# Patient Record
Sex: Female | Born: 1955 | Race: White | Hispanic: No | Marital: Married | Smoking: Former smoker
Health system: Southern US, Community
[De-identification: ages and names within clinical notes are randomized; demographics above are authoritative.]

## PROBLEM LIST (undated history)

## (undated) DIAGNOSIS — M25551 Pain in right hip: Secondary | ICD-10-CM

## (undated) DIAGNOSIS — M1990 Unspecified osteoarthritis, unspecified site: Secondary | ICD-10-CM

## (undated) DIAGNOSIS — G8929 Other chronic pain: Secondary | ICD-10-CM

## (undated) DIAGNOSIS — R918 Other nonspecific abnormal finding of lung field: Secondary | ICD-10-CM

## (undated) DIAGNOSIS — J449 Chronic obstructive pulmonary disease, unspecified: Secondary | ICD-10-CM

---

## 2010-03-01 ENCOUNTER — Ambulatory Visit: Payer: Self-pay | Admitting: Diagnostic Radiology

## 2010-03-01 ENCOUNTER — Ambulatory Visit: Payer: Self-pay | Admitting: Radiology

## 2010-03-01 ENCOUNTER — Emergency Department (HOSPITAL_BASED_OUTPATIENT_CLINIC_OR_DEPARTMENT_OTHER): Admission: EM | Admit: 2010-03-01 | Discharge: 2010-03-01 | Payer: Self-pay | Admitting: Emergency Medicine

## 2012-02-28 ENCOUNTER — Emergency Department (HOSPITAL_BASED_OUTPATIENT_CLINIC_OR_DEPARTMENT_OTHER)
Admission: EM | Admit: 2012-02-28 | Discharge: 2012-02-28 | Disposition: A | Discharge status: Home / Self Care | Payer: Worker's Compensation | Attending: Emergency Medicine | Admitting: Emergency Medicine

## 2012-02-28 ENCOUNTER — Encounter (HOSPITAL_BASED_OUTPATIENT_CLINIC_OR_DEPARTMENT_OTHER): Payer: Self-pay | Admitting: *Deleted

## 2012-02-28 ENCOUNTER — Emergency Department (HOSPITAL_BASED_OUTPATIENT_CLINIC_OR_DEPARTMENT_OTHER): Payer: Worker's Compensation

## 2012-02-28 DIAGNOSIS — Y990 Civilian activity done for income or pay: Secondary | ICD-10-CM | POA: Insufficient documentation

## 2012-02-28 DIAGNOSIS — G8929 Other chronic pain: Secondary | ICD-10-CM | POA: Insufficient documentation

## 2012-02-28 DIAGNOSIS — Y929 Unspecified place or not applicable: Secondary | ICD-10-CM | POA: Insufficient documentation

## 2012-02-28 DIAGNOSIS — W208XXA Other cause of strike by thrown, projected or falling object, initial encounter: Secondary | ICD-10-CM | POA: Insufficient documentation

## 2012-02-28 DIAGNOSIS — Z79899 Other long term (current) drug therapy: Secondary | ICD-10-CM | POA: Insufficient documentation

## 2012-02-28 DIAGNOSIS — S9031XA Contusion of right foot, initial encounter: Secondary | ICD-10-CM

## 2012-02-28 DIAGNOSIS — S9030XA Contusion of unspecified foot, initial encounter: Secondary | ICD-10-CM | POA: Insufficient documentation

## 2012-02-28 DIAGNOSIS — Z87891 Personal history of nicotine dependence: Secondary | ICD-10-CM | POA: Insufficient documentation

## 2012-02-28 HISTORY — DX: Pain in right hip: M25.551

## 2012-02-28 HISTORY — DX: Other chronic pain: G89.29

## 2012-02-28 MED ORDER — OXYCODONE-ACETAMINOPHEN 5-325 MG PO TABS: 1.0000 | ORAL_TABLET | ORAL | Status: AC | PRN

## 2012-02-28 NOTE — ED Notes (Signed)
Pt is here with right foot pain after a work accident where a machine fell down on her foot.  Pt has foot wrapped in towel

## 2012-02-28 NOTE — ED Notes (Signed)
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

## 2012-02-28 NOTE — ED Provider Notes (Signed)
History     CSN: [id number]  Arrival date & time 02/28/12  1011   First MD Initiated Contact with Patient 02/28/12 1059      Chief Complaint  Patient presents with  . Foot Injury    (Consider location/radiation/quality/duration/timing/severity/associated sxs/prior treatment) Patient is a 56 y.o. female presenting with foot injury. The history is provided by the patient.  Foot Injury   She states that the door of a prior came off its hinges and fell landing on her right foot. She is complaining of severe pain in that foot and rates pain at 8/10. Is not able to ambulate because of pain. She denies other injury.  Past Medical History  Diagnosis Date  . Chronic right hip pain     History reviewed. No pertinent past surgical history.  No family history on file.  History  Substance Use Topics  . Smoking status: Former Games developer  . Smokeless tobacco: Not on file  . Alcohol Use: Yes     Comment: occasionally    OB History    Grav Para Term Preterm Abortions TAB SAB Ect Mult Living                  Review of Systems  All other systems reviewed and are negative.    Allergies  Penicillins  Home Medications   Current Outpatient Rx  Name  Route  Sig  Dispense  Refill  . HYDROCODONE-ACETAMINOPHEN 5-325 MG PO TABS   Oral   Take 1 tablet by mouth every 6 (six) hours as needed.           BP 132/102  Pulse 67  Temp 97.7 F (36.5 C) (Oral)  Resp 18  SpO2 100%  Physical Exam  Nursing note and vitals reviewed.  56 year old female, resting comfortably and in no acute distress. Vital signs are  Significant for diastolic hypertension with blood pressure 132/102. Oxygen saturation is 100%, which is normal. Head is normocephalic and atraumatic. PERRLA. Oropharynx is clear. Strabismus is present with right eye deviated medially. Neck is nontender and supple without adenopathy or JVD. Back is nontender and there is no CVA tenderness. Lungs are clear without rales,  wheezes, or rhonchi. Chest is nontender. Heart has regular rate and rhythm without murmur. Abdomen is soft, flat, nontender without masses or hepatosplenomegaly and peristalsis is normoactive. Extremities have no cyanosis or edema, full range of motion is present. Skin is warm and dry without rash. Neurologic: Mental status is normal, cranial nerves are intact, there are no motor or sensory deficits.  ED Course  Procedures (including critical care time)  Dg Foot Complete Right  02/28/2012  *RADIOLOGY REPORT*  Clinical Data: Pain and bruising, trauma  RIGHT FOOT COMPLETE - 3+ VIEW  Comparison: None  Findings: Osseous demineralization. Joint spaces preserved. No acute fracture, dislocation or bone destruction.  IMPRESSION: No acute osseous abnormalities.   Original Report Authenticated By: Ulyses Southward, M.D.    Images viewed by me.   1. Contusion of foot, right       MDM  Contusion of right foot without evidence of fracture. She's not able to ambulate so she'll be given crutches and given a prescription for Percocet for pain.        Dione Booze, MD 02/28/12 1113

## 2018-02-19 ENCOUNTER — Emergency Department (HOSPITAL_BASED_OUTPATIENT_CLINIC_OR_DEPARTMENT_OTHER): Payer: Medicare HMO

## 2018-02-19 ENCOUNTER — Encounter (HOSPITAL_BASED_OUTPATIENT_CLINIC_OR_DEPARTMENT_OTHER): Payer: Self-pay | Admitting: *Deleted

## 2018-02-19 ENCOUNTER — Emergency Department (HOSPITAL_BASED_OUTPATIENT_CLINIC_OR_DEPARTMENT_OTHER)
Admission: EM | Admit: 2018-02-19 | Discharge: 2018-02-20 | Disposition: A | Discharge status: Other Acute Inpatient Hospital | Payer: Medicare HMO | Attending: Emergency Medicine | Admitting: Emergency Medicine

## 2018-02-19 ENCOUNTER — Other Ambulatory Visit: Payer: Self-pay

## 2018-02-19 DIAGNOSIS — R070 Pain in throat: Secondary | ICD-10-CM | POA: Insufficient documentation

## 2018-02-19 DIAGNOSIS — K56609 Unspecified intestinal obstruction, unspecified as to partial versus complete obstruction: Secondary | ICD-10-CM | POA: Diagnosis not present

## 2018-02-19 DIAGNOSIS — J449 Chronic obstructive pulmonary disease, unspecified: Secondary | ICD-10-CM | POA: Diagnosis not present

## 2018-02-19 DIAGNOSIS — Z87891 Personal history of nicotine dependence: Secondary | ICD-10-CM | POA: Insufficient documentation

## 2018-02-19 DIAGNOSIS — R109 Unspecified abdominal pain: Secondary | ICD-10-CM | POA: Diagnosis present

## 2018-02-19 DIAGNOSIS — Z79899 Other long term (current) drug therapy: Secondary | ICD-10-CM | POA: Insufficient documentation

## 2018-02-19 DIAGNOSIS — Z0189 Encounter for other specified special examinations: Secondary | ICD-10-CM

## 2018-02-19 HISTORY — DX: Other nonspecific abnormal finding of lung field: R91.8

## 2018-02-19 HISTORY — DX: Unspecified osteoarthritis, unspecified site: M19.90

## 2018-02-19 HISTORY — DX: Chronic obstructive pulmonary disease, unspecified: J44.9

## 2018-02-19 LAB — COMPREHENSIVE METABOLIC PANEL
ALBUMIN: 4.8 g/dL (ref 3.5–5.0)
ALK PHOS: 52 U/L (ref 38–126)
ALT: 25 U/L (ref 0–44)
AST: 31 U/L (ref 15–41)
Anion gap: 12 (ref 5–15)
BILIRUBIN TOTAL: 0.2 mg/dL — AB (ref 0.3–1.2)
BUN: 9 mg/dL (ref 8–23)
CALCIUM: 9.7 mg/dL (ref 8.9–10.3)
CO2: 26 mmol/L (ref 22–32)
Chloride: 101 mmol/L (ref 98–111)
Creatinine, Ser: 0.86 mg/dL (ref 0.44–1.00)
GFR calc Af Amer: >60 mL/min (ref >60)
GFR calc non Af Amer: >60 mL/min (ref >60)
GLUCOSE: 128 mg/dL — AB (ref 70–99)
Potassium: 4.5 mmol/L (ref 3.5–5.1)
SODIUM: 139 mmol/L (ref 135–145)
TOTAL PROTEIN: 8.1 g/dL (ref 6.5–8.1)

## 2018-02-19 LAB — CBC
HCT: 43.7 % (ref 36.0–46.0)
Hemoglobin: 14.3 g/dL (ref 12.0–15.0)
MCH: 31.3 pg (ref 26.0–34.0)
MCHC: 32.7 g/dL (ref 30.0–36.0)
MCV: 95.6 fL (ref 80.0–100.0)
PLATELETS: 247 10*3/uL (ref 150–400)
RBC: 4.57 MIL/uL (ref 3.87–5.11)
RDW: 12.3 % (ref 11.5–15.5)
WBC: 13.3 10*3/uL — ABNORMAL HIGH (ref 4.0–10.5)
nRBC: 0 % (ref 0.0–0.2)

## 2018-02-19 LAB — URINALYSIS, ROUTINE W REFLEX MICROSCOPIC
Bilirubin Urine: NEGATIVE
GLUCOSE, UA: NEGATIVE mg/dL
HGB URINE DIPSTICK: NEGATIVE
Ketones, ur: NEGATIVE mg/dL
Nitrite: NEGATIVE
PROTEIN: NEGATIVE mg/dL
Specific Gravity, Urine: 1.01 (ref 1.005–1.030)
pH: 7 (ref 5.0–8.0)

## 2018-02-19 LAB — URINALYSIS, MICROSCOPIC (REFLEX): RBC / HPF: NONE SEEN RBC/hpf (ref 0–5)

## 2018-02-19 LAB — LIPASE, BLOOD: Lipase: 42 U/L (ref 11–51)

## 2018-02-19 MED ORDER — LACTATED RINGERS IV BOLUS
1000.0000 mL | Freq: Once | INTRAVENOUS | Status: AC
  Administered 2018-02-19: 1000 mL via INTRAVENOUS

## 2018-02-19 MED ORDER — ALBUTEROL SULFATE HFA 108 (90 BASE) MCG/ACT IN AERS
2.0000 | INHALATION_SPRAY | Freq: Four times a day (QID) | RESPIRATORY_TRACT | Status: DC | PRN
  Administered 2018-02-19: 2 via RESPIRATORY_TRACT
  Filled 2018-02-19: qty 6.7

## 2018-02-19 MED ORDER — FENTANYL CITRATE (PF) 100 MCG/2ML IJ SOLN
50.0000 ug | Freq: Once | INTRAMUSCULAR | Status: AC
  Administered 2018-02-19: 50 ug via INTRAVENOUS
  Filled 2018-02-19: qty 2

## 2018-02-19 MED ORDER — ONDANSETRON HCL 4 MG/2ML IJ SOLN
4.0000 mg | Freq: Once | INTRAMUSCULAR | Status: AC
  Administered 2018-02-19: 4 mg via INTRAVENOUS
  Filled 2018-02-19: qty 2

## 2018-02-19 MED ORDER — FENTANYL CITRATE (PF) 100 MCG/2ML IJ SOLN
50.0000 ug | INTRAMUSCULAR | Status: DC | PRN
  Administered 2018-02-20 (×2): 50 ug via INTRAVENOUS
  Filled 2018-02-19 (×2): qty 2

## 2018-02-19 MED ORDER — ONDANSETRON HCL 4 MG/2ML IJ SOLN: 4.0000 mg | Freq: Four times a day (QID) | INTRAMUSCULAR | Status: DC | PRN

## 2018-02-19 MED ORDER — LIDOCAINE HCL URETHRAL/MUCOSAL 2 % EX GEL: 1.0000 "application " | Freq: Once | CUTANEOUS | Status: DC

## 2018-02-19 MED ORDER — IOPAMIDOL (ISOVUE-300) INJECTION 61%
100.0000 mL | Freq: Once | INTRAVENOUS | Status: AC | PRN
  Administered 2018-02-19: 100 mL via INTRAVENOUS

## 2018-02-19 MED ORDER — FENTANYL CITRATE (PF) 100 MCG/2ML IJ SOLN
INTRAMUSCULAR | Status: AC
  Filled 2018-02-19: qty 2

## 2018-02-19 MED ORDER — LACTATED RINGERS IV SOLN
INTRAVENOUS | Status: DC
  Administered 2018-02-19: 23:00:00 via INTRAVENOUS

## 2018-02-19 NOTE — ED Provider Notes (Addendum)
[facility] EMERGENCY DEPARTMENT Provider Note   CSN: [id number] Arrival date & time: 02/19/18  2021     History   Chief Complaint Chief Complaint  Patient presents with  . Abdominal Pain    HPI Jamie Pace is a 62 y.o. female.  The history is provided by the patient.  Abdominal Pain   This is a new problem. The current episode started 1 to 2 hours ago. The problem occurs constantly. The problem has not changed since onset.The pain is associated with an unknown factor. The pain is located in the epigastric region. The quality of the pain is aching and dull. The pain is at a severity of 4/10. The pain is moderate. Pertinent negatives include anorexia, fever, belching, diarrhea, flatus, hematochezia, melena, nausea, vomiting, constipation, dysuria, frequency, hematuria, headaches, arthralgias and myalgias. Nothing aggravates the symptoms. Nothing relieves the symptoms. Past workup includes surgery. Her past medical history is significant for GERD.    Past Medical History:  Diagnosis Date  . Arthritis   . Chronic right hip pain   . COPD (chronic obstructive pulmonary disease) (HCC)   . Lung nodules     There are no active problems to display for this patient.   History reviewed. No pertinent surgical history.   OB History   None      Home Medications    Prior to Admission medications   Medication Sig Start Date End Date Taking? Authorizing Provider  albuterol (PROVENTIL HFA;VENTOLIN HFA) 108 (90 Base) MCG/ACT inhaler Inhale into the lungs every 6 (six) hours as needed for wheezing or shortness of breath (shortness of breath).   Yes [provider]  alendronate (FOSAMAX) 70 MG tablet Take 70 mg by mouth once a week. Take with a full glass of water on an empty stomach.   Yes [provider]  cholecalciferol (VITAMIN D3) 10 MCG (400 UNIT) TABS tablet Take 1,000 Units by mouth daily.   Yes [provider]  cyclobenzaprine (FLEXERIL) 5  MG tablet Take 5 mg by mouth 3 times/day as needed-between meals & bedtime for muscle spasms.   Yes [provider]  famotidine (PEPCID) 20 MG tablet Take 20 mg by mouth 2 (two) times daily.   Yes [provider]  fexofenadine (ALLEGRA) 180 MG tablet Take 180 mg by mouth daily.   Yes [provider]  Fluticasone-Umeclidin-Vilant (TRELEGY ELLIPTA) 100-62.5-25 MCG/INH AEPB Inhale into the lungs.   Yes [provider]  gabapentin (NEURONTIN) 300 MG capsule Take 600 mg by mouth at bedtime.   Yes [provider]  meloxicam (MOBIC) 15 MG tablet Take 15 mg by mouth daily. Take with food   Yes [provider]  Multiple Vitamin (MULTIVITAMIN) tablet Take 1 tablet by mouth daily.   Yes [provider]  potassium chloride (K-DUR) 10 MEQ tablet Take 20 mEq by mouth daily.   Yes [provider]  valACYclovir (VALTREX) 1000 MG tablet Take 1,000 mg by mouth daily.   Yes [provider]  HYDROcodone-acetaminophen (NORCO/VICODIN) 5-325 MG per tablet Take 1 tablet by mouth every 6 (six) hours as needed.    [provider]  oxyCODONE-acetaminophen (ROXICET) 5-325 MG per tablet Take 1 tablet by mouth every 4 (four) hours as needed for pain. 02/28/12   Dione Booze, MD    Family History No family history on file.  Social History Social History   Tobacco Use  . Smoking status: Former Smoker  Substance Use Topics  . Alcohol use: Yes  Comment: occasionally  . Drug use: No     Allergies   Penicillins   Review of Systems Review of Systems  Constitutional: Negative for chills and fever.  HENT: Negative for ear pain and sore throat.   Eyes: Negative for pain and visual disturbance.  Respiratory: Negative for cough and shortness of breath.   Cardiovascular: Negative for chest pain and palpitations.  Gastrointestinal: Positive for abdominal pain. Negative for anorexia, constipation, diarrhea, flatus, hematochezia,  melena, nausea and vomiting.  Genitourinary: Negative for dysuria, frequency and hematuria.  Musculoskeletal: Negative for arthralgias, back pain and myalgias.  Skin: Negative for color change and rash.  Neurological: Negative for seizures, syncope and headaches.  All other systems reviewed and are negative.    Physical Exam Updated Vital Signs  ED Triage Vitals  Enc Vitals Group     BP 02/19/18 2027 (!) 162/112     Pulse Rate 02/19/18 2027 97     Resp 02/19/18 2027 (!) 22     Temp 02/19/18 2027 98.2 F (36.8 C)     Temp Source 02/19/18 2027 Oral     SpO2 02/19/18 2027 97 %     Weight 02/19/18 2027 114 lb (51.7 kg)     Height 02/19/18 2027 5' (1.524 m)     Head Circumference --      Peak Flow --      Pain Score 02/19/18 2026 10     Pain Loc --      Pain Edu? --      Excl. in GC? --     Physical Exam  Constitutional: She appears well-developed and well-nourished. No distress.  HENT:  Head: Normocephalic and atraumatic.  Eyes: Pupils are equal, round, and reactive to light. Conjunctivae and EOM are normal.  Neck: Neck supple.  Cardiovascular: Normal rate, regular rhythm, normal heart sounds and intact distal pulses.  No murmur heard. Pulmonary/Chest: Effort normal and breath sounds normal. No respiratory distress.  Abdominal: Soft. Normal appearance. She exhibits distension. There is tenderness in the epigastric area. There is guarding. There is no rigidity, no rebound, no CVA tenderness, no tenderness at McBurney's point and negative Murphy's sign.  Musculoskeletal: She exhibits no edema.  Neurological: She is alert.  Skin: Skin is warm and dry. Capillary refill takes less than 2 seconds.  Psychiatric: She has a normal mood and affect.  Nursing note and vitals reviewed.    ED Treatments / Results  Labs (all labs ordered are listed, but only abnormal results are displayed) Labs Reviewed  COMPREHENSIVE METABOLIC PANEL - Abnormal; Notable for the following components:       Result Value   Glucose, Bld 128 (*)    Total Bilirubin 0.2 (*)    All other components within normal limits  CBC - Abnormal; Notable for the following components:   WBC 13.3 (*)    All other components within normal limits  URINALYSIS, ROUTINE W REFLEX MICROSCOPIC - Abnormal; Notable for the following components:   Leukocytes, UA SMALL (*)    All other components within normal limits  URINALYSIS, MICROSCOPIC (REFLEX) - Abnormal; Notable for the following components:   Bacteria, UA FEW (*)    All other components within normal limits  LIPASE, BLOOD    EKG EKG Interpretation  Date/Time:  Wednesday February 19 2018 21:23:24 EST Ventricular Rate:  95 PR Interval:    QRS Duration: 94 QT Interval:  370 QTC Calculation: 466 R Axis:   69 Text Interpretation:  Sinus rhythm Borderline prolonged  PR interval Low voltage, precordial leads Confirmed by Virgina Norfolk [phone]) on 02/19/2018 10:42:01 PM   Radiology Dg Chest 2 View  Result Date: 02/19/2018 CLINICAL DATA:  Epigastric pain EXAM: CHEST - 2 VIEW COMPARISON:  04/27/2015 FINDINGS: The heart size and mediastinal contours are within normal limits. Both lungs are clear. The visualized skeletal structures are unremarkable. IMPRESSION: No active cardiopulmonary disease. Electronically Signed   By: Jasmine Pang M.D.   On: 02/19/2018 22:08   Ct Abdomen Pelvis W Contrast  Result Date: 02/19/2018 CLINICAL DATA:  Upper abdominal pain radiating to the back. Abdominal distension. Hemorrhoidectomy and lateral internal sphincterotomy earlier today. EXAM: CT ABDOMEN AND PELVIS WITH CONTRAST TECHNIQUE: Multidetector CT imaging of the abdomen and pelvis was performed using the standard protocol following bolus administration of intravenous contrast. CONTRAST:  ISOVUE-300 IOPAMIDOL (ISOVUE-300) INJECTION 61% COMPARISON:  Abdominal radiographs 12/26/2017.  Chest CT 04/18/2017. FINDINGS: Lower chest: Centrilobular emphysema. No basilar lung  consolidation or pleural effusion. Normal heart size. Hepatobiliary: No focal liver abnormality is seen. The gallbladder is contracted. Mild dilatation of the common bile duct to 8 mm diameter with tapering distally and no obstructing stone identified, similar to the prior chest CT. Pancreas: Upper limits of normal pancreatic duct in the proximal body and head. No pancreatic inflammation. Spleen: Unremarkable. Adrenals/Urinary Tract: Unremarkable adrenal glands. No evidence of renal mass, calculi, or hydronephrosis. Unremarkable bladder. Stomach/Bowel: The stomach is markedly distended by fluid. The proximal small bowel is fluid-filled and dilated up to 3.3 cm in diameter. There is a transition to decompressed small bowel in the midline of the lower abdomen/pelvis. The colon is largely decompressed. A moderately large duodenal diverticulum is noted. There is colonic diverticulosis without evidence of diverticulitis. Distal rectal wall thickening and eccentric gas are likely related to surgery earlier today. The appendix is unremarkable. Vascular/Lymphatic: Abdominal aortic atherosclerosis without aneurysm. No enlarged lymph nodes. Reproductive: Uterus and bilateral adnexa are unremarkable. Other: No intraperitoneal free fluid. Musculoskeletal: No acute osseous abnormality or suspicious osseous lesion. Mild lumbar disc degeneration. IMPRESSION: 1. Small bowel obstruction with transition point in the lower abdomen/pelvis. 2. Colonic diverticulosis. 3.  Aortic Atherosclerosis (ICD10-I70.0). Electronically Signed   By: Sebastian Ache M.D.   On: 02/19/2018 22:19    Procedures Procedures (including critical care time)  Medications Ordered in ED Medications  lactated ringers infusion ( Intravenous New Bag/Given 02/19/18 2248)  lidocaine (XYLOCAINE) 2 % jelly 1 application (has no administration in time range)  lactated ringers bolus 1,000 mL ( Intravenous Stopped 02/19/18 2244)  fentaNYL (SUBLIMAZE) injection 50 mcg  (50 mcg Intravenous Given 02/19/18 2116)  iopamidol (ISOVUE-300) 61 % injection 100 mL (100 mLs Intravenous Contrast Given 02/19/18 2140)  ondansetron (ZOFRAN) injection 4 mg (4 mg Intravenous Given 02/19/18 2254)     Initial Impression / Assessment and Plan / ED Course  I have reviewed the triage vital signs and the nursing notes.  Pertinent labs & imaging results that were available during my care of the patient were reviewed by me and considered in my medical decision making (see chart for details).     KERLY RIGSBEE is a 62 year old female history of COPD, reflux who presents the ED with abdominal pain.  Patient with unremarkable vitals.  No fever.  Patient underwent surgical procedure for hemorrhoid care today at outpatient surgical center.  She states that she was doing well one about an hour ago she developed severe abdominal pain and distention.  She denies any history of abdominal surgery.  She has felt nauseous but no vomiting.  She has passed some gas but has not had a bowel movement.  She is distended on exam and tender throughout but mostly in the epigastric region.  She denies any chest pain, shortness of breath.  Concern for possible small bowel obstruction versus ileus versus gastritis versus perforation.  Patient to get labs, CT abdomen and pelvis, EKG, chest x-ray.  Chest x-ray shows no signs of free air, no pneumothorax, no pleural effusion, no pneumonia.  EKG shows sinus rhythm.  No ischemic changes.  Lab work overall unremarkable except for mild leukocytosis.  No significant anemia, electrolyte abnormality, kidney injury.  CT scan shows small bowel obstruction with transition point.  General surgery at [facility], Dr. Claudine Mouton, was consulted and recommends NG tube and hospitalist admission at [facility].  I discussed case with hospitalist, Dr. Anselmo Rod, at [facility] and patient to be admitted for further care, awaiting transfer to [facility].     This chart was dictated using voice recognition software.  Despite best efforts to proofread,  errors can occur which can change the documentation meaning.   Final Clinical Impressions(s) / ED Diagnoses   Final diagnoses:  SBO (small bowel obstruction) [facility])    ED Discharge Orders    None       Virgina Norfolk, DO 02/19/18 2344    Virgina Norfolk, DO 02/19/18 2353

## 2018-02-19 NOTE — ED Notes (Signed)
Called [facility] line and asked for [facility] hospitalist to call dr Lockie Mola

## 2018-02-19 NOTE — ED Notes (Signed)
Patient transported to CT 

## 2018-02-19 NOTE — ED Notes (Signed)
Portable xray at bedside for KUB.

## 2018-02-19 NOTE — ED Triage Notes (Signed)
Pt reports upper abdominal pain that radiates around to her back for about 1 hour tonight. No nausea or vomiting. She states she did have an outpatient procedure done this morning to the rectal area. Last took vicodin at 1800 for pain.

## 2018-02-19 NOTE — ED Notes (Signed)
Pt on monitor 

## 2018-02-20 MED ORDER — BENZOCAINE 20 % MT AERO
INHALATION_SPRAY | OROMUCOSAL | Status: AC
  Administered 2018-02-20: 1
  Filled 2018-02-20: qty 57

## 2018-02-20 NOTE — ED Notes (Signed)
[facility] arrived to transport pt to [facility]

## 2018-02-20 NOTE — ED Notes (Signed)
Pt and husband updated on bed status, no beds available at [facility].

## 2018-02-20 NOTE — ED Notes (Signed)
Pt and husband updated on bed status, no beds available at [facility]. 

## 2018-02-20 NOTE — ED Provider Notes (Signed)
Blood pressure 124/78, pulse (!) 104, temperature 99.8 F (37.7 C), temperature source Oral, resp. rate 20, height 5' (1.524 m), weight 51.7 kg, SpO2 93 %.  Assuming care from Dr. Bebe Shaggy.  In short, Jamie Pace is a 61 y.o. female with a chief complaint of Abdominal Pain .  Refer to the original H&P for additional details.  The current plan of care is to continue to manage here for SBO while waiting for bed to become available at [facility].   7:53 AM Reassessed the patient. She is overall feeling well but having some discomfort in her throat which appears to be from the NG tube. We are waiting for bed to open at [facility]. IV fluids at a rate. Continue NPO but did offer occasional ice chips to keep mouth moist.   2:24 PM Patient accepted to [facility]. Bed Assigned. Will renew EMTALA. Patient stable for discharge.     Maia Plan, MD 02/20/18 [phone]

## 2018-02-20 NOTE — ED Notes (Signed)
Pt going to [facility] 6N- room 683. Nurse contact information: [phone]

## 2018-02-20 NOTE — ED Notes (Signed)
ED Provider at bedside. Dr. Jacqulyn Bath provides pt with ice chips.

## 2018-02-20 NOTE — ED Notes (Signed)
Pt states her throat pain is increasing, now 6/10, requests pain medication.

## 2019-05-30 ENCOUNTER — Ambulatory Visit: Payer: Self-pay

## 2019-05-31 ENCOUNTER — Ambulatory Visit: Payer: Medicare HMO | Attending: Internal Medicine

## 2019-05-31 DIAGNOSIS — Z23 Encounter for immunization: Secondary | ICD-10-CM | POA: Insufficient documentation

## 2019-05-31 NOTE — Progress Notes (Signed)
   Covid-19 Vaccination Clinic  Name:  ONETA SIGMAN    MRN: [id number] DOB: [birth year]  05/31/2019  Ms. Corporan was observed post Covid-19 immunization for 15 minutes without incidence. She was provided with Vaccine Information Sheet and instruction to access the V-Safe system.   Ms. Santee was instructed to call 911 with any severe reactions post vaccine: Marland Kitchen Difficulty breathing  . Swelling of your face and throat  . A fast heartbeat  . A bad rash all over your body  . Dizziness and weakness    Immunizations Administered    Name Date Dose VIS Date Route   Pfizer COVID-19 Vaccine 05/31/2019 12:08 PM 0.3 mL 03/27/2019 Intramuscular   Manufacturer: [employer], [employer]   Lot: [id number]   NDC: [address]-0

## 2019-06-23 ENCOUNTER — Ambulatory Visit: Payer: Medicare HMO | Attending: Internal Medicine

## 2019-06-23 DIAGNOSIS — Z23 Encounter for immunization: Secondary | ICD-10-CM | POA: Insufficient documentation

## 2019-06-23 NOTE — Progress Notes (Signed)
   Covid-19 Vaccination Clinic  Name:  Jamie Pace    MRN: [id number] DOB: 1955/10/15  06/23/2019  [name] was observed post Covid-19 immunization for 15 minutes without incident. She was provided with Vaccine Information Sheet and instruction to access the V-Safe system.   [name] was instructed to call 911 with any severe reactions post vaccine: Marland Kitchen Difficulty breathing  . Swelling of face and throat  . A fast heartbeat  . A bad rash all over body  . Dizziness and weakness   Immunizations Administered    Name Date Dose VIS Date Route   Pfizer COVID-19 Vaccine 06/23/2019  4:04 PM 0.3 mL 03/27/2019 Intramuscular   Manufacturer: [employer], [employer]   Lot: [id number]   NDC: [address]-1

## 2020-06-08 IMAGING — DX DG ABD PORTABLE 1V
1 series · 1 of 1 positions shown · non-contrast
Comparison: 12/26/2017

CLINICAL DATA: NG tube placement

EXAM:
PORTABLE ABDOMEN - 1 VIEW

[abdomen supine]
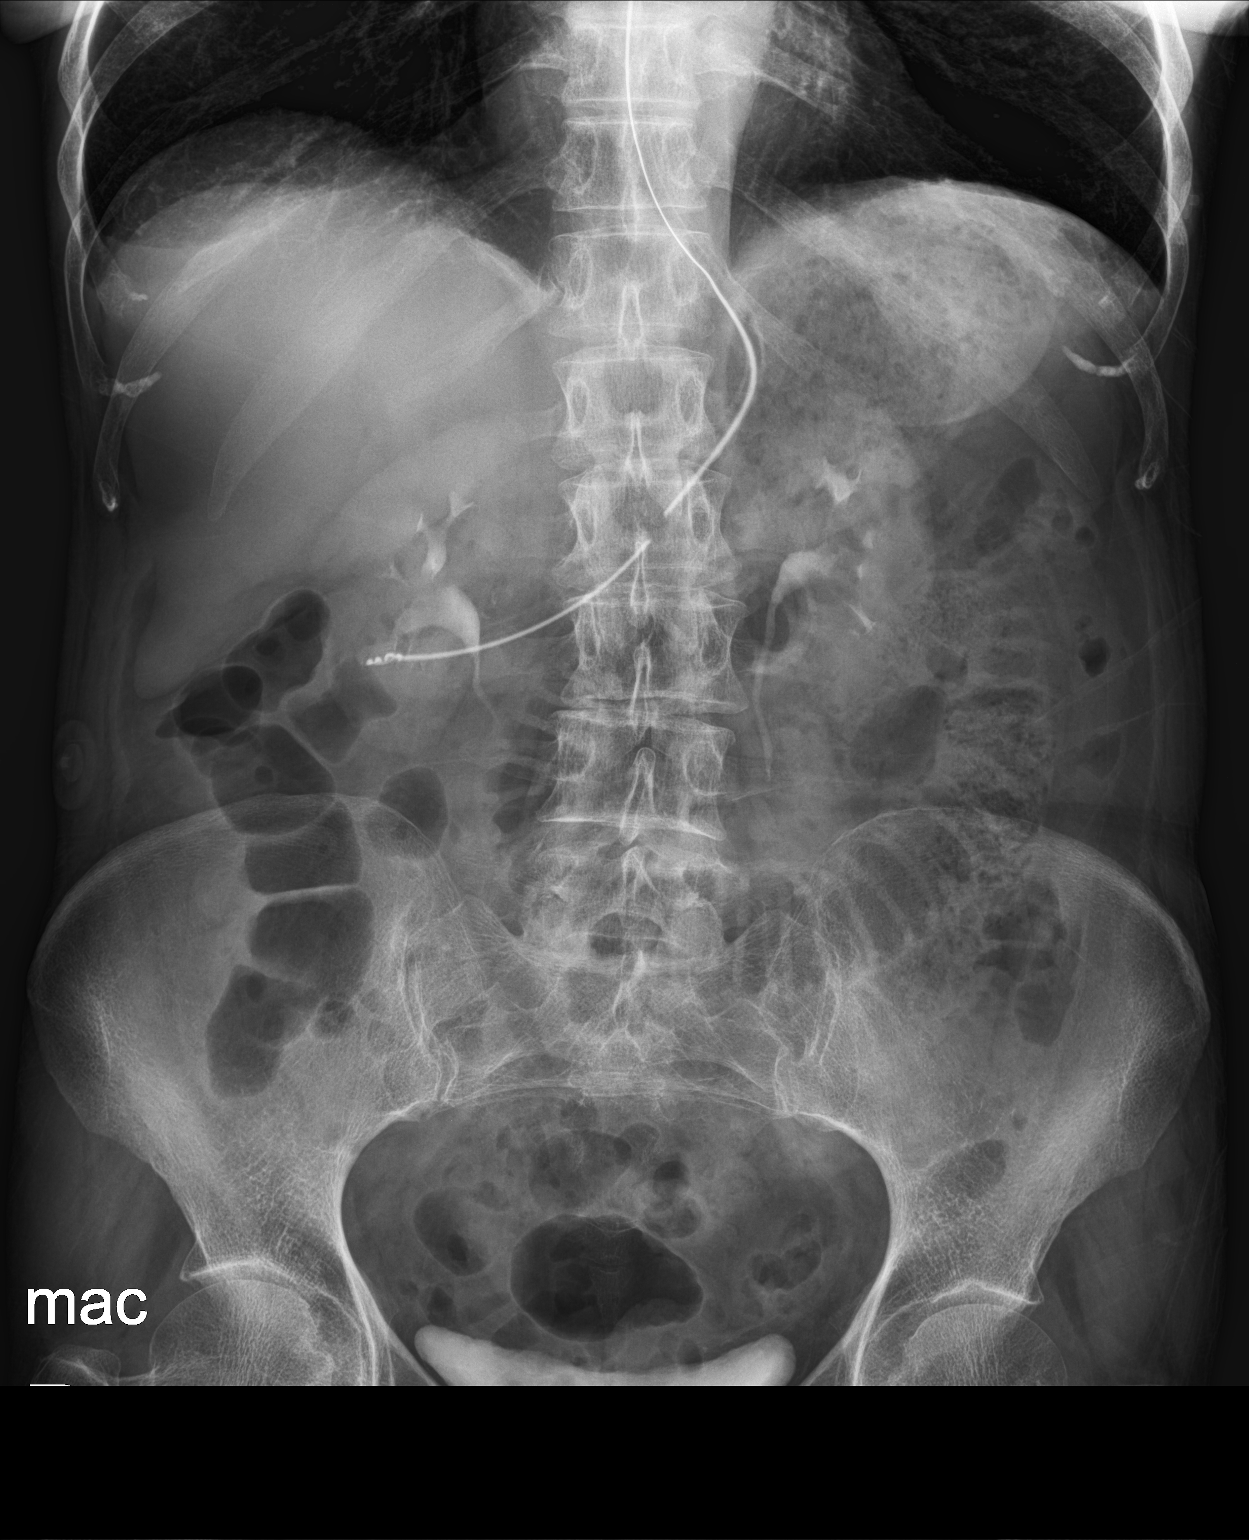

[1 of 1 positions shown; findings below may reference images not displayed]

FINDINGS: Esophageal tube tip overlies the gastric outlet. Residual contrast
in the renal collecting systems and bladder. Dilated central small
bowel, corresponding to history of bowel obstruction.
IMPRESSION: Esophageal tube tip projects over gastric outlet

## 2020-06-08 IMAGING — CT CT ABD-PELV W/ CM
2 of 5 series · 15 of 46 positions shown, 17 images · IV contrast (iopamidol)
Comparison: Abdominal radiographs 12/26/2017.  Chest CT 04/18/2017.

CLINICAL DATA: Upper abdominal pain radiating to the back.
Abdominal distension. Hemorrhoidectomy and lateral internal
sphincterotomy earlier today.

EXAM:
CT ABDOMEN AND PELVIS WITH CONTRAST
TECHNIQUE: Multidetector CT imaging of the abdomen and pelvis was performed
using the standard protocol following bolus administration of
intravenous contrast.
CONTRAST:  100mL [id number] IOPAMIDOL ([id number]) INJECTION 61%

[Series 2: axial st · axial · 0.65mm/px · z∈[-473,-83]mm · 12 of 88 slices shown, 14 images]
[im 5/88  soft-tissue]
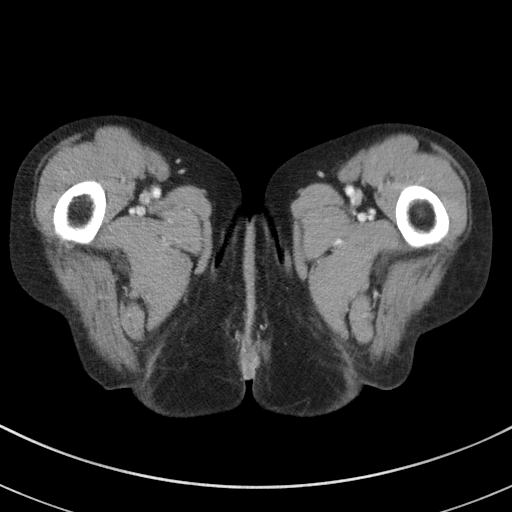
[im 5/88  bone]
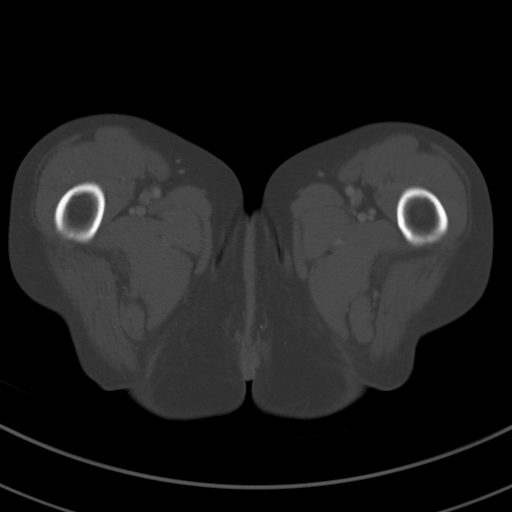
[im 15/88  soft-tissue]
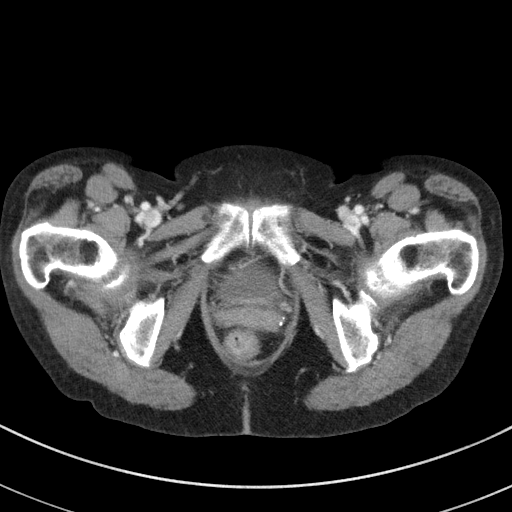
[im 20/88  soft-tissue]
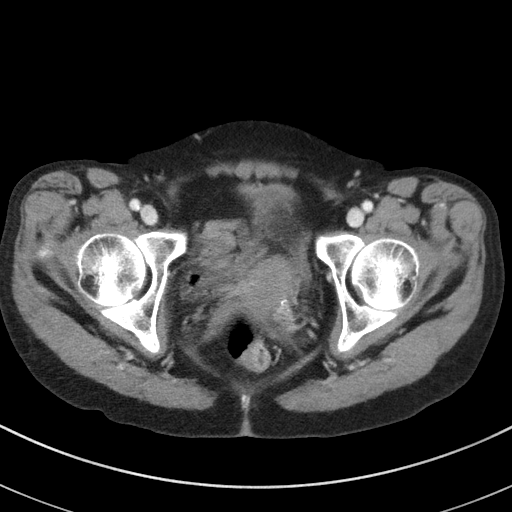
[im 25/88  soft-tissue]
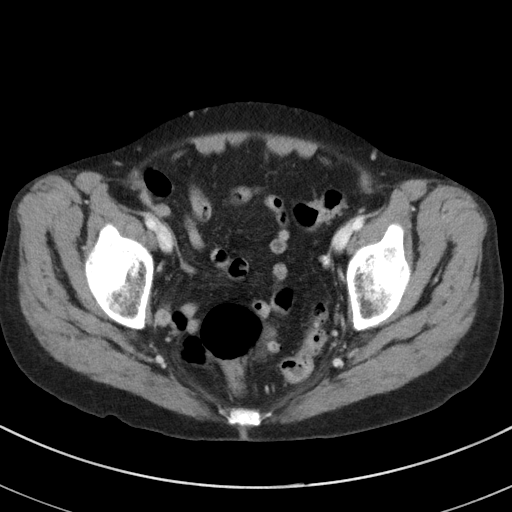
[im 34/88  soft-tissue]
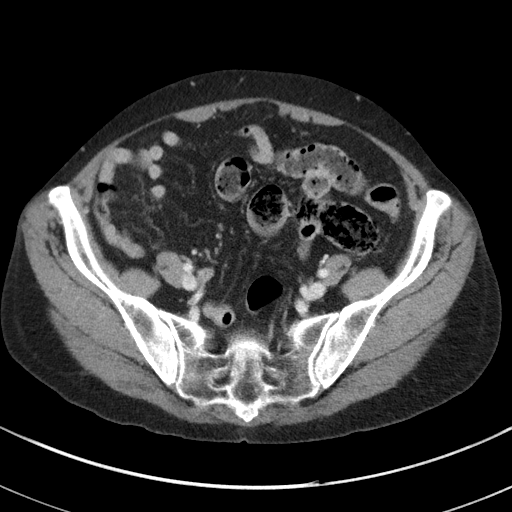
[im 39/88  soft-tissue]
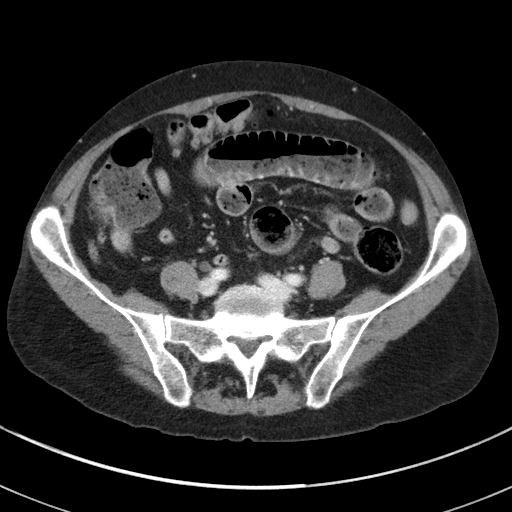
[im 49/88  soft-tissue]
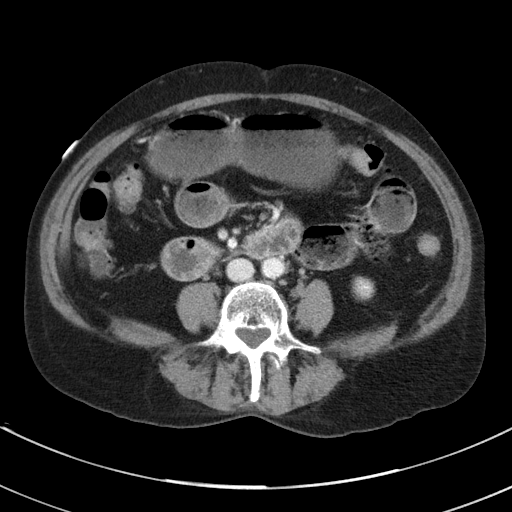
[im 54/88  soft-tissue]
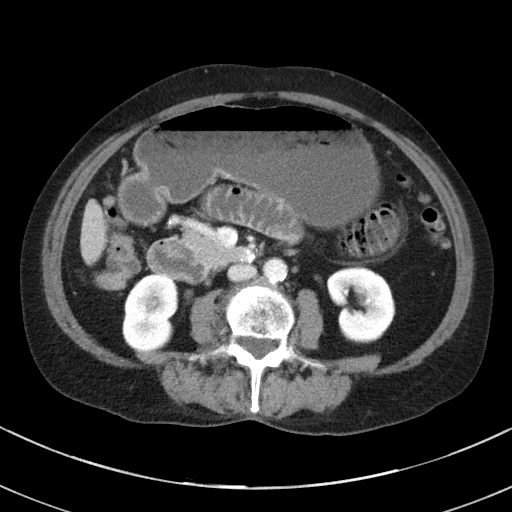
[im 63/88  soft-tissue]
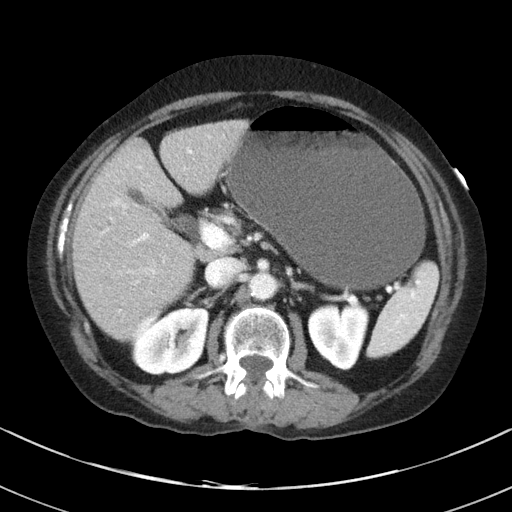
[im 63/88  bone]
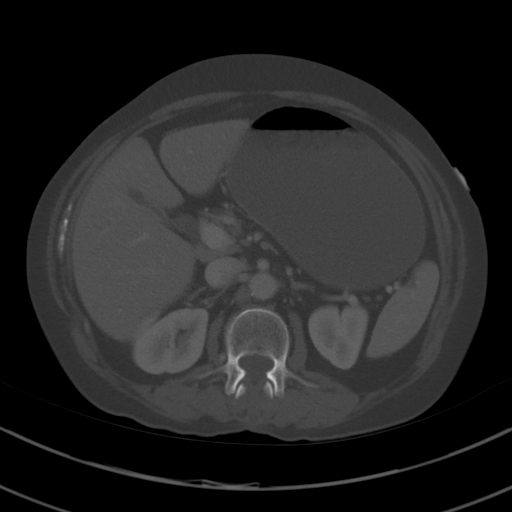
[im 68/88  soft-tissue]
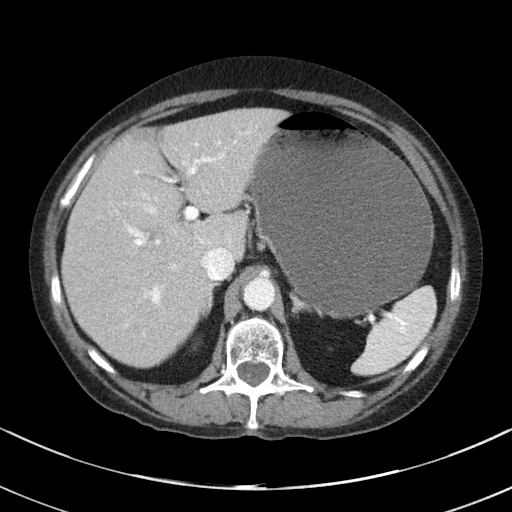
[im 73/88  soft-tissue]
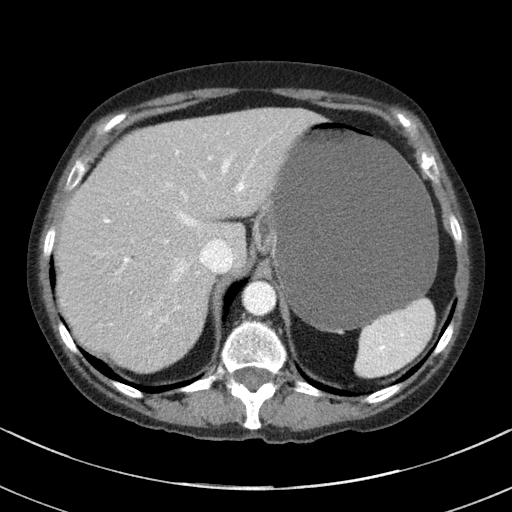
[im 83/88  soft-tissue]
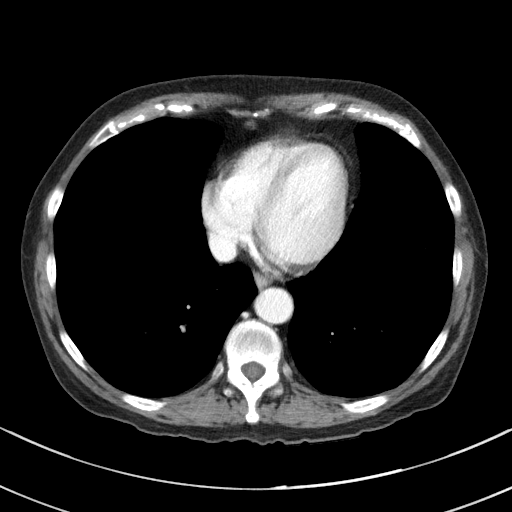

[Series 5: coronal st · coronal · 0.64mm/px · 3 of 87 slices shown]
[im 29/87  soft-tissue]
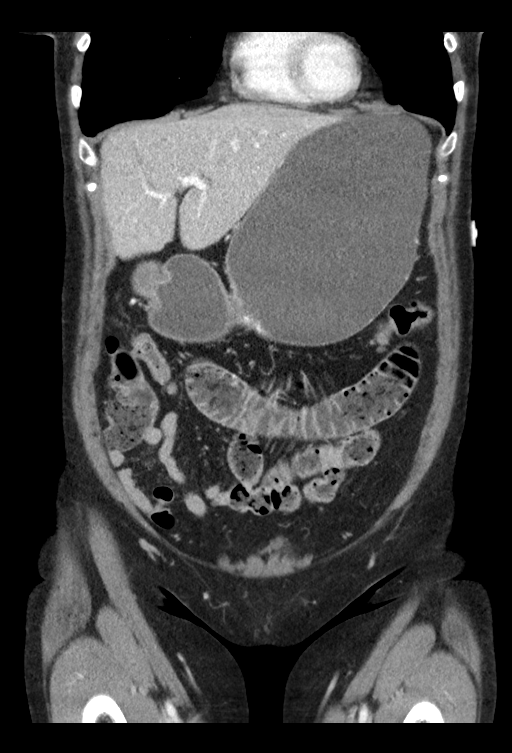
[im 39/87  soft-tissue]
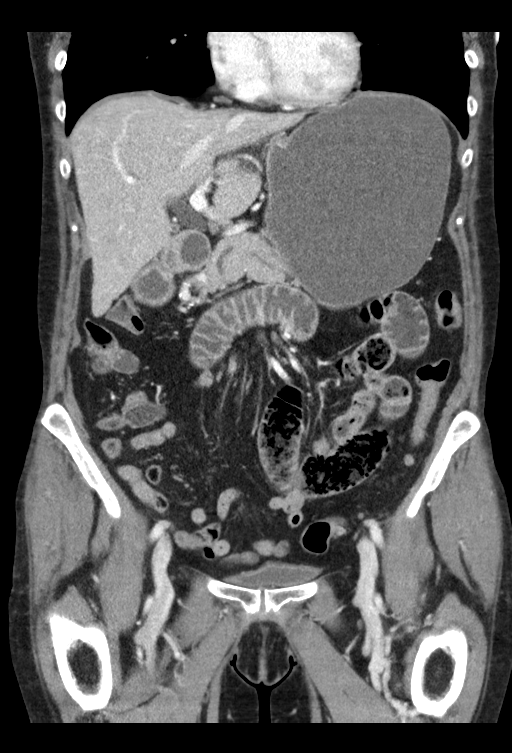
[im 48/87  soft-tissue]
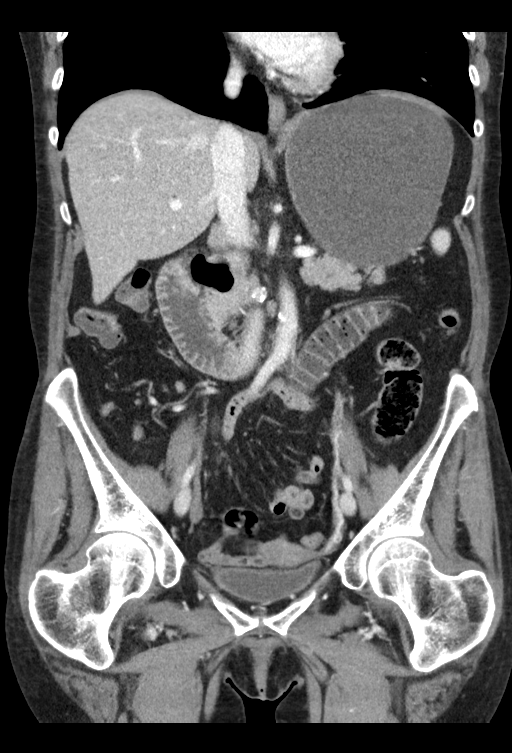

[15 of 46 positions shown; findings below may reference images not displayed]

FINDINGS: Lower chest: Centrilobular emphysema. No basilar lung consolidation
or pleural effusion. Normal heart size.

Hepatobiliary: No focal liver abnormality is seen. The gallbladder
is contracted. Mild dilatation of the common bile duct to 8 mm
diameter with tapering distally and no obstructing stone identified,
similar to the prior chest CT.

Pancreas: Upper limits of normal pancreatic duct in the proximal
body and head. No pancreatic inflammation.

Spleen: Unremarkable.

Adrenals/Urinary Tract: Unremarkable adrenal glands. No evidence of
renal mass, calculi, or hydronephrosis. Unremarkable bladder.

Stomach/Bowel: The stomach is markedly distended by fluid. The
proximal small bowel is fluid-filled and dilated up to 3.3 cm in
diameter. There is a transition to decompressed small bowel in the
midline of the lower abdomen/pelvis. The colon is largely
decompressed. A moderately large duodenal diverticulum is noted.
There is colonic diverticulosis without evidence of diverticulitis.
Distal rectal wall thickening and eccentric gas are likely related
to surgery earlier today. The appendix is unremarkable.

Vascular/Lymphatic: Abdominal aortic atherosclerosis without
aneurysm. No enlarged lymph nodes.

Reproductive: Uterus and bilateral adnexa are unremarkable.

Other: No intraperitoneal free fluid.

Musculoskeletal: No acute osseous abnormality or suspicious osseous
lesion. Mild lumbar disc degeneration.
IMPRESSION: 1. Small bowel obstruction with transition point in the lower
abdomen/pelvis.
2. Colonic diverticulosis.
3.  Aortic Atherosclerosis ([id number]).
